# Patient Record
Sex: Female | Born: 1945 | Race: White | Hispanic: No | Marital: Married | Smoking: Never smoker
Health system: Southern US, Community
[De-identification: ages and names within clinical notes are randomized; demographics above are authoritative.]

## PROBLEM LIST (undated history)

## (undated) HISTORY — PX: OOPHORECTOMY: SHX86

---

## 2019-12-20 ENCOUNTER — Other Ambulatory Visit: Payer: Self-pay

## 2019-12-20 ENCOUNTER — Emergency Department (HOSPITAL_BASED_OUTPATIENT_CLINIC_OR_DEPARTMENT_OTHER)
Admission: EM | Admit: 2019-12-20 | Discharge: 2019-12-20 | Disposition: A | Discharge status: Home / Self Care | Payer: Medicare Other | Attending: Emergency Medicine | Admitting: Emergency Medicine

## 2019-12-20 ENCOUNTER — Encounter (HOSPITAL_BASED_OUTPATIENT_CLINIC_OR_DEPARTMENT_OTHER): Payer: Self-pay | Admitting: *Deleted

## 2019-12-20 ENCOUNTER — Emergency Department (HOSPITAL_BASED_OUTPATIENT_CLINIC_OR_DEPARTMENT_OTHER): Payer: Medicare Other

## 2019-12-20 DIAGNOSIS — U071 COVID-19: Secondary | ICD-10-CM

## 2019-12-20 DIAGNOSIS — R059 Cough, unspecified: Secondary | ICD-10-CM | POA: Diagnosis present

## 2019-12-20 LAB — BASIC METABOLIC PANEL
Anion gap: 10 (ref 5–15)
BUN: 12 mg/dL (ref 8–23)
CO2: 26 mmol/L (ref 22–32)
Calcium: 8.8 mg/dL — ABNORMAL LOW (ref 8.9–10.3)
Chloride: 99 mmol/L (ref 98–111)
Creatinine, Ser: 0.95 mg/dL (ref 0.44–1.00)
GFR, Estimated: >60 mL/min (ref >60)
Glucose, Bld: 110 mg/dL — ABNORMAL HIGH (ref 70–99)
Potassium: 3.8 mmol/L (ref 3.5–5.1)
Sodium: 135 mmol/L (ref 135–145)

## 2019-12-20 LAB — CBC WITH DIFFERENTIAL/PLATELET
Abs Immature Granulocytes: 0.01 10*3/uL (ref 0.00–0.07)
Basophils Absolute: 0 10*3/uL (ref 0.0–0.1)
Basophils Relative: 1 %
Eosinophils Absolute: 0 10*3/uL (ref 0.0–0.5)
Eosinophils Relative: 0 %
HCT: 43.5 % (ref 36.0–46.0)
Hemoglobin: 14.3 g/dL (ref 12.0–15.0)
Immature Granulocytes: 0 %
Lymphocytes Relative: 24 %
Lymphs Abs: 0.9 10*3/uL (ref 0.7–4.0)
MCH: 29.2 pg (ref 26.0–34.0)
MCHC: 32.9 g/dL (ref 30.0–36.0)
MCV: 88.8 fL (ref 80.0–100.0)
Monocytes Absolute: 0.6 10*3/uL (ref 0.1–1.0)
Monocytes Relative: 16 %
Neutro Abs: 2.3 10*3/uL (ref 1.7–7.7)
Neutrophils Relative %: 59 %
Platelets: 182 10*3/uL (ref 150–400)
RBC: 4.9 MIL/uL (ref 3.87–5.11)
RDW: 13.2 % (ref 11.5–15.5)
WBC: 3.8 10*3/uL — ABNORMAL LOW (ref 4.0–10.5)
nRBC: 0 % (ref 0.0–0.2)

## 2019-12-20 LAB — RESP PANEL BY RT-PCR (FLU A&B, COVID) ARPGX2
Influenza A by PCR: NEGATIVE
Influenza B by PCR: NEGATIVE
SARS Coronavirus 2 by RT PCR: POSITIVE — AB

## 2019-12-20 NOTE — Discharge Instructions (Signed)
Would recommend taking vitamin D over-the-counter zinc supplement and a multivitamin. Return for any difficulty breathing or feeling short of breath.

## 2019-12-20 NOTE — ED Notes (Signed)
Lab called with a positive covid. Dr. Deretha Emory aware.

## 2019-12-20 NOTE — ED Provider Notes (Addendum)
[facility] EMERGENCY DEPARTMENT Provider Note   CSN: [id number] Arrival date & time: 12/20/19  2038     History Chief Complaint  Patient presents with  . Fatigue    + covid test yesterday    Jordan Ingram is a 74 y.o. female.  Patient is on day 6 of Covid symptoms. Had positive home test. Patient is complained of headache fatigue and poor appetite no nausea vomiting. No shortness of breath. But has had some congestion no respiratory symptoms. No hypoxia here oxygen saturations 96%. No fever. Respiratory rates not up no hypotension. No tachycardia. No chest pain.         History reviewed. No pertinent past medical history.  There are no problems to display for this patient.   Past Surgical History:  Procedure Laterality Date  . OOPHORECTOMY       OB History   No obstetric history on file.     No family history on file.  Social History   Tobacco Use  . Smoking status: Never Smoker  . Smokeless tobacco: Never Used  Vaping Use  . Vaping Use: Never used  Substance Use Topics  . Alcohol use: Never  . Drug use: Never    Home Medications Prior to Admission medications   Not on File    Allergies    Guaifenesin  Review of Systems   Review of Systems  Constitutional: Positive for appetite change and fatigue. Negative for chills and fever.  HENT: Positive for congestion. Negative for rhinorrhea and sore throat.   Eyes: Negative for visual disturbance.  Respiratory: Negative for cough and shortness of breath.   Cardiovascular: Negative for chest pain and leg swelling.  Gastrointestinal: Negative for abdominal pain, diarrhea, nausea and vomiting.  Genitourinary: Negative for dysuria.  Musculoskeletal: Negative for back pain, myalgias and neck pain.  Skin: Negative for rash.  Neurological: Negative for dizziness, light-headedness and headaches.  Hematological: Does not bruise/bleed easily.  Psychiatric/Behavioral: Negative for confusion.     Physical Exam Updated Vital Signs BP (!) 131/49 (BP Location: Left Arm)   Pulse 75   Temp 99.3 F (37.4 C) (Oral)   Resp 16   Ht 1.575 m (5\' 2" )   Wt 64.4 kg   SpO2 96%   BMI 25.97 kg/m   Physical Exam Vitals and nursing note reviewed.  Constitutional:      General: She is not in acute distress.    Appearance: Normal appearance. She is well-developed and well-nourished.  HENT:     Head: Normocephalic and atraumatic.  Eyes:     Conjunctiva/sclera: Conjunctivae normal.     Pupils: Pupils are equal, round, and reactive to light.  Cardiovascular:     Rate and Rhythm: Normal rate and regular rhythm.     Heart sounds: No murmur heard.   Pulmonary:     Effort: Pulmonary effort is normal. No respiratory distress.     Breath sounds: Normal breath sounds. No wheezing.  Abdominal:     Palpations: Abdomen is soft.     Tenderness: There is no abdominal tenderness.  Musculoskeletal:        General: No edema. Normal range of motion.     Cervical back: Normal range of motion and neck supple.  Skin:    General: Skin is warm and dry.     Capillary Refill: Capillary refill takes less than 2 seconds.  Neurological:     General: No focal deficit present.     Mental Status: She is alert and  oriented to person, place, and time.     Cranial Nerves: No cranial nerve deficit.     Sensory: No sensory deficit.     Motor: No weakness.  Psychiatric:        Mood and Affect: Mood and affect normal.     ED Results / Procedures / Treatments   Labs (all labs ordered are listed, but only abnormal results are displayed) Labs Reviewed  RESP PANEL BY RT-PCR (FLU A&B, COVID) ARPGX2 - Abnormal; Notable for the following components:      Result Value   SARS Coronavirus 2 by RT PCR POSITIVE (*)    All other components within normal limits  CBC WITH DIFFERENTIAL/PLATELET - Abnormal; Notable for the following components:   WBC 3.8 (*)    All other components within normal limits  BASIC  METABOLIC PANEL - Abnormal; Notable for the following components:   Glucose, Bld 110 (*)    Calcium 8.8 (*)    All other components within normal limits    EKG None  Radiology DG Chest Portable 1 View  Result Date: 12/20/2019 CLINICAL DATA:  COVID.  Headache, fatigue. EXAM: PORTABLE CHEST 1 VIEW COMPARISON:  CT 10/26/2015 FINDINGS: Heart and mediastinal contours are within normal limits. No focal opacities or effusions. No acute bony abnormality. IMPRESSION: No active disease. Electronically Signed   By: Charlett Nose M.D.   On: 12/20/2019 21:22    Procedures Procedures (including critical care time)  Medications Ordered in ED Medications - No data to display  ED Course  I have reviewed the triage vital signs and the nursing notes.  Pertinent labs & imaging results that were available during my care of the patient were reviewed by me and considered in my medical decision making (see chart for details).    MDM Rules/Calculators/A&P                          Patient nontoxic no acute distress. Patient candidate for monoclonal antibody. Was what her granddaughter wanted her to receive tonight but patient is refusing the infusion. Patient not vaccinated. Patient's chest x-ray without evidence of any pneumonia. Labs without significant abnormality T's. Covid test is positive flu test is negative. We'll treat patient symptomatically. Have her return for any worsening shortness of breath or trouble breathing.       Final Clinical Impression(s) / ED Diagnoses Final diagnoses:  COVID    Rx / DC Orders ED Discharge Orders    None       Vanetta Mulders, MD 12/20/19 [id number]    Vanetta Mulders, MD 12/20/19 2342

## 2019-12-20 NOTE — ED Notes (Signed)
ED Provider at bedside. Dr. Deretha Emory in to discuss antibody infusion with pt. Info sheet provided

## 2019-12-20 NOTE — ED Triage Notes (Signed)
Pt reports she tested + for covid yesterday with a home test. Reports Sx x 6 days. States she thought she had a sinus infection. C/o headache, fatigue and poor appetite

## 2019-12-21 ENCOUNTER — Telehealth (HOSPITAL_COMMUNITY): Payer: Self-pay | Admitting: Family

## 2019-12-21 DIAGNOSIS — U071 COVID-19: Secondary | ICD-10-CM

## 2019-12-21 NOTE — Telephone Encounter (Signed)
Called to discuss with Lenn Sink about Covid symptoms and the use of casirivimab/imdevimab, a combination monoclonal antibody infusion for those with mild to moderate Covid symptoms and at a high risk of hospitalization.     Pt is qualified for this infusion at the infusion center due to co-morbid conditions and/or a member of an at-risk group, however declines infusion at this time as she states she feels better. She states her grand-daughter is a Engineer, civil (consulting) and can help her as needed. Symptoms tier reviewed.  Symptoms reviewed that would warrant ED/Hospital evaluation. Patient verbalized understanding. Offered the hotline phone number to call back if she decides that he does want to get infusion, but she says "no thank you" to taking the hotline phone number. Patient advised to go to Urgent care or ED with severe symptoms.     There are no problems to display for this patient.   Breeonna Mone,NP

## 2021-11-24 IMAGING — DX DG CHEST 1V PORT
1 series · 1 of 1 positions shown · non-contrast
Comparison: CT 10/26/2015

CLINICAL DATA: COVID.  Headache, fatigue.

EXAM:
PORTABLE CHEST 1 VIEW

[chest ap]
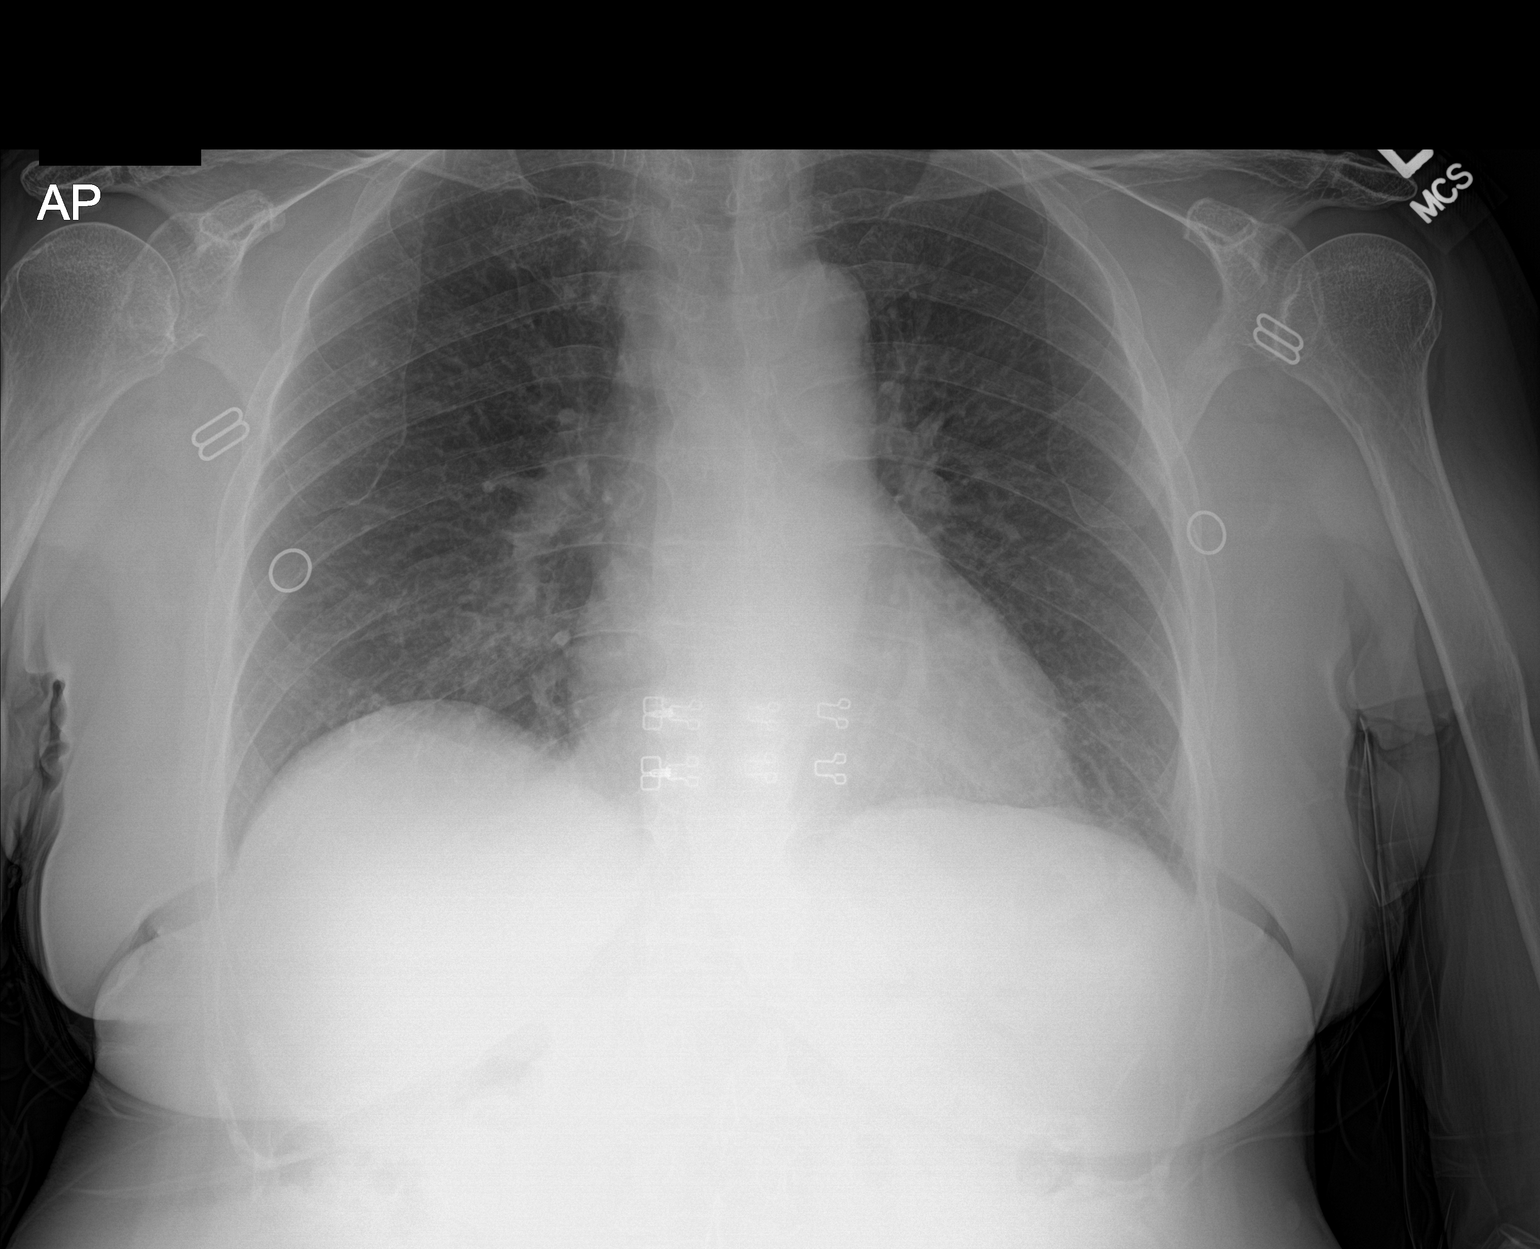

[1 of 1 positions shown; findings below may reference images not displayed]

FINDINGS: Heart and mediastinal contours are within normal limits. No focal
opacities or effusions. No acute bony abnormality.
IMPRESSION: No active disease.
